# Patient Record
Sex: Female | Born: 1985 | Race: Black or African American | Hispanic: No | Marital: Single | Smoking: Never smoker
Health system: Southern US, Community
[De-identification: ages and names within clinical notes are randomized; demographics above are authoritative.]

## PROBLEM LIST (undated history)

## (undated) ENCOUNTER — Ambulatory Visit: Payer: 59 | Source: Home / Self Care

## (undated) DIAGNOSIS — H9312 Tinnitus, left ear: Secondary | ICD-10-CM

---

## 2020-04-27 ENCOUNTER — Other Ambulatory Visit: Payer: Self-pay

## 2020-04-27 ENCOUNTER — Emergency Department
Admission: EM | Admit: 2020-04-27 | Discharge: 2020-04-27 | Disposition: A | Discharge status: Home / Self Care | Payer: 59 | Attending: Emergency Medicine | Admitting: Emergency Medicine

## 2020-04-27 ENCOUNTER — Emergency Department: Payer: 59

## 2020-04-27 DIAGNOSIS — H93A2 Pulsatile tinnitus, left ear: Secondary | ICD-10-CM | POA: Diagnosis not present

## 2020-04-27 DIAGNOSIS — H93A9 Pulsatile tinnitus, unspecified ear: Secondary | ICD-10-CM

## 2020-04-27 DIAGNOSIS — H9202 Otalgia, left ear: Secondary | ICD-10-CM | POA: Diagnosis present

## 2020-04-27 LAB — CBC
HCT: 42.6 % (ref 36.0–46.0)
Hemoglobin: 14.2 g/dL (ref 12.0–15.0)
MCH: 29 pg (ref 26.0–34.0)
MCHC: 33.3 g/dL (ref 30.0–36.0)
MCV: 86.9 fL (ref 80.0–100.0)
Platelets: 343 10*3/uL (ref 150–400)
RBC: 4.9 MIL/uL (ref 3.87–5.11)
RDW: 12.9 % (ref 11.5–15.5)
WBC: 9.2 10*3/uL (ref 4.0–10.5)
nRBC: 0 % (ref 0.0–0.2)

## 2020-04-27 LAB — BASIC METABOLIC PANEL
Anion gap: 7 (ref 5–15)
BUN: 18 mg/dL (ref 6–20)
CO2: 26 mmol/L (ref 22–32)
Calcium: 9.3 mg/dL (ref 8.9–10.3)
Chloride: 105 mmol/L (ref 98–111)
Creatinine, Ser: 0.97 mg/dL (ref 0.44–1.00)
GFR, Estimated: >60 mL/min (ref >60)
Glucose, Bld: 96 mg/dL (ref 70–99)
Potassium: 3.8 mmol/L (ref 3.5–5.1)
Sodium: 138 mmol/L (ref 135–145)

## 2020-04-27 IMAGING — CT CT ANGIO HEAD-NECK (W OR W/O PERF)
1 of 10 series · 6 of 33 positions shown · IV contrast (APPLIED)
Comparison: None.

CLINICAL DATA: Pulsatile tinnitus

EXAM:
CT ANGIOGRAPHY HEAD AND NECK
TECHNIQUE: Multidetector CT imaging of the head and neck was performed using
the standard protocol during bolus administration of intravenous
contrast. Multiplanar CT image reconstructions and MIPs were
obtained to evaluate the vascular anatomy. Carotid stenosis
measurements (when applicable) are obtained utilizing NASCET
criteria, using the distal internal carotid diameter as the
denominator.
CONTRAST:  75mL OMNIPAQUE IOHEXOL 350 MG/ML SOLN

[Series 6: ax thin · axial · 0.36mm/px · z∈[+271,+495]mm · 6 of 340 slices shown]
[im 49/340  soft-tissue]
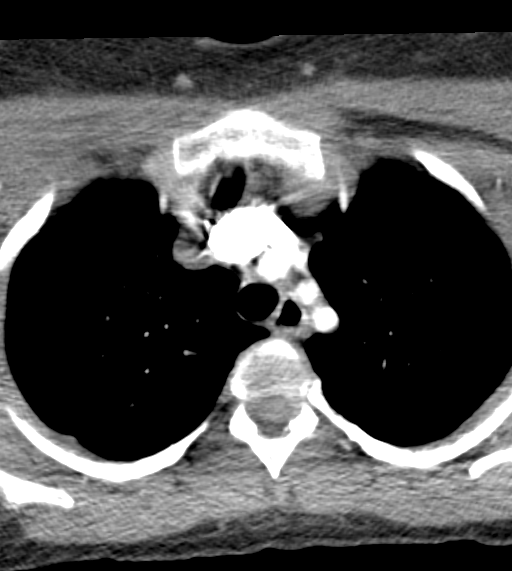
[im 97/340  bone]
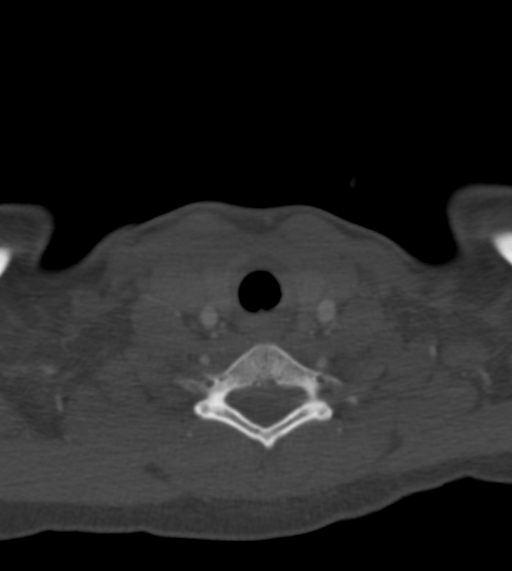
[im 146/340  soft-tissue]
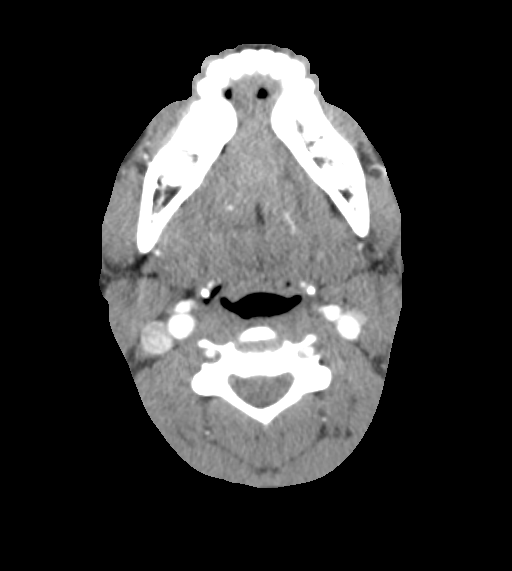
[im 194/340  bone]
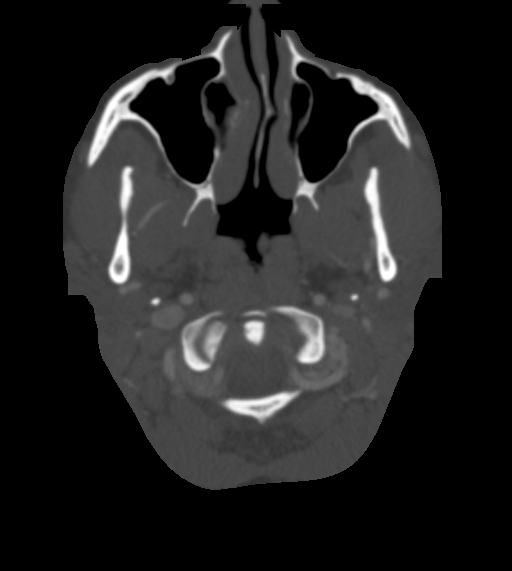
[im 243/340  soft-tissue]
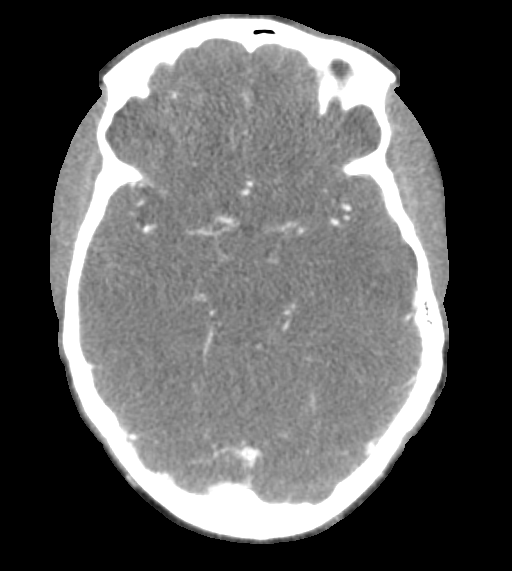
[im 291/340  bone]
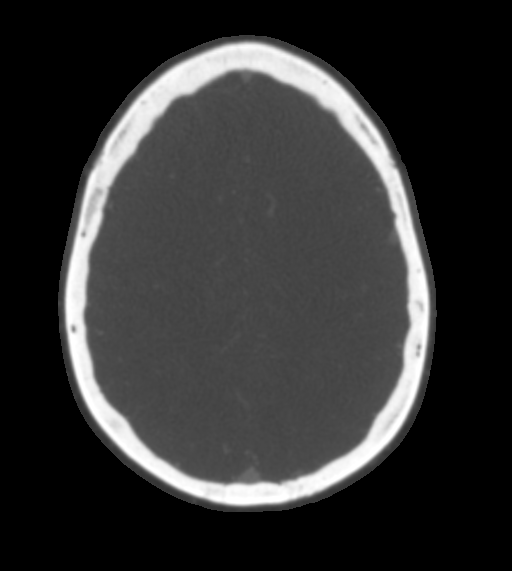

[6 of 33 positions shown; findings below may reference images not displayed]

FINDINGS: CT HEAD FINDINGS

Brain: There is no mass, hemorrhage or extra-axial collection. The
size and configuration of the ventricles and extra-axial CSF spaces
are normal. There is no acute or chronic infarction. The brain
parenchyma is normal.

Skull: The visualized skull base, calvarium and extracranial soft
tissues are normal.

Sinuses/Orbits: No fluid levels or advanced mucosal thickening of
the visualized paranasal sinuses. No mastoid or middle ear effusion.
The orbits are normal.

CTA NECK FINDINGS

SKELETON: There is no bony spinal canal stenosis. No lytic or
blastic lesion.

OTHER NECK: Normal pharynx, larynx and major salivary glands. No
cervical lymphadenopathy. Unremarkable thyroid gland.

UPPER CHEST: No pneumothorax or pleural effusion. No nodules or
masses.

AORTIC ARCH:

There is no calcific atherosclerosis of the aortic arch. There is no
aneurysm, dissection or hemodynamically significant stenosis of the
visualized portion of the aorta. Conventional 3 vessel aortic
branching pattern. The visualized proximal subclavian arteries are
widely patent.

RIGHT CAROTID SYSTEM: Normal without aneurysm, dissection or
stenosis.

LEFT CAROTID SYSTEM: Normal without aneurysm, dissection or
stenosis.

VERTEBRAL ARTERIES: Left dominant configuration. Both origins are
clearly patent. There is no dissection, occlusion or flow-limiting
stenosis to the skull base (V1-V3 segments).

CTA HEAD FINDINGS

POSTERIOR CIRCULATION:

--Vertebral arteries: Normal V4 segments.

--Inferior cerebellar arteries: Normal.

--Basilar artery: Normal.

--Superior cerebellar arteries: Normal.

--Posterior cerebral arteries (PCA): Normal.

ANTERIOR CIRCULATION:

--Intracranial internal carotid arteries: Normal.

--Anterior cerebral arteries (ACA): Normal. Both A1 segments are
present. Patent anterior communicating artery (a-comm).

--Middle cerebral arteries (MCA): Normal.

VENOUS SINUSES: As permitted by contrast timing, patent.

ANATOMIC VARIANTS: No aberrant internal carotid artery or
high-riding jugular venous bulb.

Review of the MIP images confirms the above findings.
IMPRESSION: Normal CTA of the head and neck.

## 2020-04-27 MED ORDER — IOHEXOL 350 MG/ML SOLN
75.0000 mL | Freq: Once | INTRAVENOUS | Status: AC | PRN
  Administered 2020-04-27: 75 mL via INTRAVENOUS

## 2020-04-27 NOTE — Discharge Instructions (Addendum)
The CT angiography of your head did not show any evidence of abnormalities.  Please call the number provided to arrange a follow-up appointment with ENT for further evaluation should your symptoms continue.  Return to the emergency department for any symptom personally concerning to yourself.

## 2020-04-27 NOTE — ED Notes (Signed)
Patient c/o nausea and asked for an emesis bag. Patient declined medication for nausea. MD aware.

## 2020-04-27 NOTE — ED Notes (Signed)
Patient walked with a steady gait to and from hallway bathroom to change into gown. No complaints of dizziness at this time.

## 2020-04-27 NOTE — ED Notes (Signed)
Patient is at imaging. 

## 2020-04-27 NOTE — ED Provider Notes (Signed)
[facility] Emergency Department Provider Note  Time seen: 8:18 PM  I have reviewed the triage vital signs and the nursing notes.   HISTORY  Chief Complaint Otalgia  HPI Tiffany Bowers is a 35 y.o. female with no significant past medical history who presents to the emergency department for evaluation of a pulsatile tinnitus.  According to the patient for the past 2 days or so she is heard a "whooshing" pulsatile sound in her left ear.  Denies any headache.  Denies any weakness or numbness of any arm or leg.  Patient went to urgent care and was referred to the emergency department for further evaluation.  Patient denies any ear pain.  Cannot currently hear the sound but this is a loud environment and the patient states she hurt it just before getting here.  No history of aneurysms.  History reviewed. No pertinent past medical history.  There are no problems to display for this patient.   History reviewed. No pertinent surgical history.  Prior to Admission medications   Not on File    Not on File  No family history on file.  Social History Social History   Tobacco Use  . Smoking status: Never Smoker  . Smokeless tobacco: Never Used  Substance Use Topics  . Alcohol use: Yes  . Drug use: Never    Review of Systems Constitutional: Negative for fever. ENT: Pulsatile tinnitus Cardiovascular: Negative for chest pain. Respiratory: Negative for shortness of breath. Gastrointestinal: Negative for abdominal pain Musculoskeletal: Negative for musculoskeletal complaints Neurological: Negative for headache.  Negative for weakness or numbness. All other ROS negative  ____________________________________________   PHYSICAL EXAM:  VITAL SIGNS: ED Triage Vitals [04/27/20 2008]  Enc Vitals Group     BP (!) 135/92     Pulse Rate 70     Resp 14     Temp 98.4 F (36.9 C)     Temp src      SpO2 100 %     Weight 190 lb (86.2 kg)     Height 5\' 10"  (1.778 m)      Head Circumference      Peak Flow      Pain Score 0     Pain Loc      Pain Edu?      Excl. in GC?    Constitutional: Alert and oriented. Well appearing and in no distress. Eyes: Normal exam ENT      Head: Normocephalic and atraumatic.      Mouth/Throat: Mucous membranes are moist. Cardiovascular: Normal rate, regular rhythm.  Respiratory: Normal respiratory effort without tachypnea nor retractions. Breath sounds are clear  Gastrointestinal: Soft and nontender. No distention.   Musculoskeletal: Nontender with normal range of motion in all extremities. Neurologic:  Normal speech and language. No gross focal neurologic deficits Skin:  Skin is warm, dry and intact.  Psychiatric: Mood and affect are normal.   ____________________________________________   RADIOLOGY  CT is pending  ____________________________________________   INITIAL IMPRESSION / ASSESSMENT AND PLAN / ED COURSE  Pertinent labs & imaging results that were available during my care of the patient were reviewed by me and considered in my medical decision making (see chart for details).   Patient presents to the emergency department for pulsatile tinnitus over the past 2 days.  Referred from urgent care for CT imaging.  Patient denies any ear pain.  No headache.  No weakness or numbness, confusion or slurred speech.  Overall the patient appears well  with a reassuring physical exam including normal tympanic membranes.  However given the patient's pulsatile tinnitus symptoms we will obtain a CTA of the head and neck to rule out aneurysmal dilation.  Patient agreeable to plan of care.  I discussed with the patient if CTAs are negative I would anticipate discharge home and symptoms hopefully will resolve spontaneously otherwise will follow up with ENT.  CTAs are pending.  Patient care signed out to Dr. Vicente Males.  Tiffany Bowers was evaluated in Emergency Department on 04/27/2020 for the symptoms described in the history of  present illness. She was evaluated in the context of the global COVID-19 pandemic, which necessitated consideration that the patient might be at risk for infection with the SARS-CoV-2 virus that causes COVID-19. Institutional protocols and algorithms that pertain to the evaluation of patients at risk for COVID-19 are in a state of rapid change based on information released by regulatory bodies including the CDC and federal and state organizations. These policies and algorithms were followed during the patient's care in the ED.  ____________________________________________   FINAL CLINICAL IMPRESSION(S) / ED DIAGNOSES  Pulsatile tinnitus   Minna Antis, MD 04/27/20 2021

## 2020-04-27 NOTE — ED Triage Notes (Signed)
Pt states for the past 2 days she has had wooshing sound in left ear that has been in sync with her pulse. Pt was seen at [facility] and told to come here, denies any pain or drainage from ear

## 2020-06-01 ENCOUNTER — Encounter: Payer: Self-pay | Admitting: Otolaryngology

## 2020-08-22 ENCOUNTER — Ambulatory Visit (HOSPITAL_COMMUNITY)
Admission: EM | Admit: 2020-08-22 | Discharge: 2020-08-22 | Disposition: A | Discharge status: Home / Self Care | Payer: Medicaid Other

## 2020-08-22 ENCOUNTER — Other Ambulatory Visit: Payer: Self-pay

## 2020-08-22 ENCOUNTER — Ambulatory Visit (HOSPITAL_COMMUNITY): Payer: Self-pay

## 2023-01-16 ENCOUNTER — Ambulatory Visit
Admission: EM | Admit: 2023-01-16 | Discharge: 2023-01-16 | Disposition: A | Discharge status: Home / Self Care | Payer: 59 | Attending: Family Medicine | Admitting: Family Medicine

## 2023-01-16 VITALS — BP 102/70 | HR 64 | Temp 98.6°F | Resp 16

## 2023-01-16 DIAGNOSIS — R300 Dysuria: Secondary | ICD-10-CM | POA: Insufficient documentation

## 2023-01-16 LAB — POCT URINALYSIS DIP (MANUAL ENTRY)
Bilirubin, UA: NEGATIVE
Glucose, UA: NEGATIVE mg/dL
Ketones, POC UA: NEGATIVE mg/dL
Leukocytes, UA: NEGATIVE
Nitrite, UA: NEGATIVE
Protein Ur, POC: NEGATIVE mg/dL
Spec Grav, UA: 1.025 (ref 1.010–1.025)
Urobilinogen, UA: 0.2 U/dL
pH, UA: 6 (ref 5.0–8.0)

## 2023-01-16 LAB — POCT URINE PREGNANCY: Preg Test, Ur: NEGATIVE

## 2023-01-16 MED ORDER — CEPHALEXIN 500 MG PO CAPS: 500.0000 mg | ORAL_CAPSULE | Freq: Two times a day (BID) | ORAL | 0 refills | Status: AC

## 2023-01-16 NOTE — ED Provider Notes (Addendum)
 [facility]    CSN: [id number] Arrival date & time: 01/16/23  1355      History   Chief Complaint No chief complaint on file.   HPI Tiffany Bowers is a 38 y.o. female presents for dysuria.  Patient reports 1 week of urinary burning with hematuria.  Denies urgency, frequency, fevers, nausea/vomiting, flank pain.  No vaginal discharge or STD concern.  Reports remote history of UTIs.  Is taking cranberry pills OTC which seemed to help.  No other concerns at this time.  HPI  History reviewed. No pertinent past medical history.  There are no active problems to display for this patient.   History reviewed. No pertinent surgical history.  OB History   No obstetric history on file.      Home Medications    Prior to Admission medications   Medication Sig Start Date End Date Taking? Authorizing Provider  cephALEXin  (KEFLEX ) 500 MG capsule Take 1 capsule (500 mg total) by mouth 2 (two) times daily for 7 days. 01/16/23 01/23/23 Yes Loreda Myla SAUNDERS, NP    Family History History reviewed. No pertinent family history.  Social History Social History   Tobacco Use   Smoking status: Never   Smokeless tobacco: Never  Substance Use Topics   Alcohol use: Yes   Drug use: Never     Allergies   Scopolamine   Review of Systems Review of Systems  Genitourinary:  Positive for dysuria.     Physical Exam Triage Vital Signs ED Triage Vitals  Encounter Vitals Group     BP 01/16/23 1435 102/70     Systolic BP Percentile --      Diastolic BP Percentile --      Pulse Rate 01/16/23 1435 64     Resp 01/16/23 1435 16     Temp 01/16/23 1435 98.6 F (37 C)     Temp Source 01/16/23 1435 Oral     SpO2 01/16/23 1435 97 %     Weight --      Height --      Head Circumference --      Peak Flow --      Pain Score 01/16/23 1434 1     Pain Loc --      Pain Education --      Exclude from Growth Chart --    No data found.  Updated Vital Signs BP 102/70 (BP Location: Left Arm)    Pulse 64   Temp 98.6 F (37 C) (Oral)   Resp 16   SpO2 97%   Visual Acuity Right Eye Distance:   Left Eye Distance:   Bilateral Distance:    Right Eye Near:   Left Eye Near:    Bilateral Near:     Physical Exam Vitals and nursing note reviewed.  Constitutional:      Appearance: Normal appearance.  HENT:     Head: Normocephalic and atraumatic.  Eyes:     Pupils: Pupils are equal, round, and reactive to light.  Cardiovascular:     Rate and Rhythm: Normal rate.  Pulmonary:     Effort: Pulmonary effort is normal.  Abdominal:     Tenderness: There is no right CVA tenderness or left CVA tenderness.  Skin:    General: Skin is warm and dry.  Neurological:     General: No focal deficit present.     Mental Status: She is alert and oriented to person, place, and time.  Psychiatric:  Mood and Affect: Mood normal.        Behavior: Behavior normal.      UC Treatments / Results  Labs (all labs ordered are listed, but only abnormal results are displayed) Labs Reviewed  POCT URINALYSIS DIP (MANUAL ENTRY) - Abnormal; Notable for the following components:      Result Value   Blood, UA trace-intact (*)    All other components within normal limits  URINE CULTURE  POCT URINE PREGNANCY  CERVICOVAGINAL ANCILLARY ONLY    EKG   Radiology No results found.  Procedures Procedures (including critical care time)  Medications Ordered in UC Medications - No data to display  Initial Impression / Assessment and Plan / UC Course  I have reviewed the triage vital signs and the nursing notes.  Pertinent labs & imaging results that were available during my care of the patient were reviewed by me and considered in my medical decision making (see chart for details).     Reviewed exam and symptoms with patient.  No red flags.  UA with trace blood otherwise unremarkable.  Will send urine culture and contact if positive.  Patient would prefer to start treatment while awaiting  culture results, start Keflex  twice daily for 7 days.  Patient also wanted testing for BV and yeast.  Increase fluids and rest.  PCP follow-up if symptoms do not improve.  ER precautions reviewed. Final Clinical Impressions(s) / UC Diagnoses   Final diagnoses:  Dysuria     Discharge Instructions      The clinic will contact you with results of urine culture done today if positive.  Start Keflex  twice daily for 7 days.  Lots of rest and fluids.  Please follow-up with your PCP if your symptoms do not improve.  Please go to the ER if you develop any worsening symptoms.  I hope you feel better soon!    ED Prescriptions     Medication Sig Dispense Auth. Provider   cephALEXin  (KEFLEX ) 500 MG capsule Take 1 capsule (500 mg total) by mouth 2 (two) times daily for 7 days. 14 capsule Sya Nestler, Jodi R, NP      PDMP not reviewed this encounter.   Loreda Myla SAUNDERS, NP 01/16/23 1455    Loreda Myla SAUNDERS, NP 01/16/23 [phone]

## 2023-01-16 NOTE — ED Triage Notes (Signed)
 Pt presents to UC for c/o burning on urination and hematuria for almost a week. Took cranberry pills which helped some.

## 2023-01-16 NOTE — Discharge Instructions (Addendum)
 The clinic will contact you with results of urine culture done today if positive.  Start Keflex  twice daily for 7 days.  Lots of rest and fluids.  Please follow-up with your PCP if your symptoms do not improve.  Please go to the ER if you develop any worsening symptoms.  I hope you feel better soon!

## 2023-01-17 LAB — URINE CULTURE: Culture: NO GROWTH

## 2023-01-17 LAB — CERVICOVAGINAL ANCILLARY ONLY
Bacterial Vaginitis (gardnerella): POSITIVE — AB
Candida Glabrata: NEGATIVE
Candida Vaginitis: NEGATIVE
Comment: NEGATIVE
Comment: NEGATIVE
Comment: NEGATIVE

## 2023-08-30 ENCOUNTER — Other Ambulatory Visit: Payer: Self-pay

## 2023-08-30 ENCOUNTER — Emergency Department (HOSPITAL_BASED_OUTPATIENT_CLINIC_OR_DEPARTMENT_OTHER)
Admission: EM | Admit: 2023-08-30 | Discharge: 2023-08-31 | Disposition: A | Discharge status: Home / Self Care | Attending: Emergency Medicine | Admitting: Emergency Medicine

## 2023-08-30 ENCOUNTER — Emergency Department (HOSPITAL_BASED_OUTPATIENT_CLINIC_OR_DEPARTMENT_OTHER)

## 2023-08-30 ENCOUNTER — Encounter (HOSPITAL_BASED_OUTPATIENT_CLINIC_OR_DEPARTMENT_OTHER): Payer: Self-pay | Admitting: Emergency Medicine

## 2023-08-30 DIAGNOSIS — R0609 Other forms of dyspnea: Secondary | ICD-10-CM | POA: Diagnosis not present

## 2023-08-30 DIAGNOSIS — R918 Other nonspecific abnormal finding of lung field: Secondary | ICD-10-CM | POA: Diagnosis not present

## 2023-08-30 DIAGNOSIS — R0600 Dyspnea, unspecified: Secondary | ICD-10-CM

## 2023-08-30 DIAGNOSIS — J189 Pneumonia, unspecified organism: Secondary | ICD-10-CM

## 2023-08-30 DIAGNOSIS — R0602 Shortness of breath: Secondary | ICD-10-CM | POA: Insufficient documentation

## 2023-08-30 DIAGNOSIS — J168 Pneumonia due to other specified infectious organisms: Secondary | ICD-10-CM | POA: Diagnosis not present

## 2023-08-30 HISTORY — DX: Tinnitus, left ear: H93.12

## 2023-08-30 LAB — BASIC METABOLIC PANEL WITH GFR
Anion gap: 10 (ref 5–15)
BUN: 16 mg/dL (ref 6–20)
CO2: 23 mmol/L (ref 22–32)
Calcium: 8.9 mg/dL (ref 8.9–10.3)
Chloride: 107 mmol/L (ref 98–111)
Creatinine, Ser: 0.53 mg/dL (ref 0.44–1.00)
GFR, Estimated: >60 mL/min (ref >60)
Glucose, Bld: 110 mg/dL — ABNORMAL HIGH (ref 70–99)
Potassium: 3.5 mmol/L (ref 3.5–5.1)
Sodium: 140 mmol/L (ref 135–145)

## 2023-08-30 LAB — CBC
HCT: 36.9 % (ref 36.0–46.0)
Hemoglobin: 12.4 g/dL (ref 12.0–15.0)
MCH: 28.6 pg (ref 26.0–34.0)
MCHC: 33.6 g/dL (ref 30.0–36.0)
MCV: 85.2 fL (ref 80.0–100.0)
Platelets: 281 K/uL (ref 150–400)
RBC: 4.33 MIL/uL (ref 3.87–5.11)
RDW: 12.1 % (ref 11.5–15.5)
WBC: 6.5 K/uL (ref 4.0–10.5)
nRBC: 0 % (ref 0.0–0.2)

## 2023-08-30 NOTE — ED Triage Notes (Signed)
 Pt c/o dyspnea on exertion x 3 weeks, progressively worsening. Reports today she was having difficulty catching her breath while having a conversation.   Also reports elevated HR today on smart watch. Denies chest pain, fever, recent illness, medication changes.

## 2023-08-31 ENCOUNTER — Emergency Department (HOSPITAL_BASED_OUTPATIENT_CLINIC_OR_DEPARTMENT_OTHER)

## 2023-08-31 DIAGNOSIS — R918 Other nonspecific abnormal finding of lung field: Secondary | ICD-10-CM | POA: Diagnosis not present

## 2023-08-31 DIAGNOSIS — R0602 Shortness of breath: Secondary | ICD-10-CM | POA: Diagnosis not present

## 2023-08-31 DIAGNOSIS — R0609 Other forms of dyspnea: Secondary | ICD-10-CM | POA: Diagnosis not present

## 2023-08-31 LAB — D-DIMER, QUANTITATIVE: D-Dimer, Quant: 0.63 ug{FEU}/mL — ABNORMAL HIGH (ref 0.00–0.50)

## 2023-08-31 LAB — HCG, SERUM, QUALITATIVE: Preg, Serum: NEGATIVE

## 2023-08-31 MED ORDER — SODIUM CHLORIDE 0.9 % IV BOLUS
1000.0000 mL | Freq: Once | INTRAVENOUS | Status: AC
  Administered 2023-08-31: 1000 mL via INTRAVENOUS

## 2023-08-31 MED ORDER — IOHEXOL 350 MG/ML SOLN
75.0000 mL | Freq: Once | INTRAVENOUS | Status: AC | PRN
  Administered 2023-08-31: 75 mL via INTRAVENOUS

## 2023-08-31 MED ORDER — AZITHROMYCIN 250 MG PO TABS: 250.0000 mg | ORAL_TABLET | Freq: Every day | ORAL | 0 refills | Status: AC

## 2023-08-31 MED ORDER — AZITHROMYCIN 250 MG PO TABS
500.0000 mg | ORAL_TABLET | Freq: Once | ORAL | Status: AC
  Administered 2023-08-31: 500 mg via ORAL
  Filled 2023-08-31: qty 2

## 2023-08-31 NOTE — ED Provider Notes (Signed)
 [facility] Provider Note   CSN: [id number] Arrival date & time: 08/30/23  2140     Patient presents with: Shortness of Breath   Tiffany Bowers is a 38 y.o. female.   Patient is a 38 year old female with no significant past medical history.  Patient presenting today with complaints of shortness of breath.  She has noted dyspnea with exertion over the past several weeks, but now seems to be present at rest and with conversations.  She tells me that her smart watch has notified that she has had a higher resting heart rate than normal.  She denies to me she is having any chest pain or difficulty breathing.  No fevers, chills, or cough.  No leg swelling or calf pain.  She has a progesterone IUD, but denies tobacco use.       Prior to Admission medications   Not on File    Allergies: Scopolamine    Review of Systems  All other systems reviewed and are negative.   Updated Vital Signs BP 133/79 (BP Location: Right Arm)   Pulse (!) 112   Temp 98.3 F (36.8 C)   Resp 18   Ht 5' 10 (1.778 m)   Wt 81.6 kg   SpO2 99%   BMI 25.83 kg/m   Physical Exam Vitals and nursing note reviewed.  Constitutional:      General: She is not in acute distress.    Appearance: She is well-developed. She is not diaphoretic.  HENT:     Head: Normocephalic and atraumatic.  Cardiovascular:     Rate and Rhythm: Normal rate and regular rhythm.     Heart sounds: No murmur heard.    No friction rub. No gallop.  Pulmonary:     Effort: Pulmonary effort is normal. No respiratory distress.     Breath sounds: Normal breath sounds. No wheezing.  Abdominal:     General: Bowel sounds are normal. There is no distension.     Palpations: Abdomen is soft.     Tenderness: There is no abdominal tenderness.  Musculoskeletal:        General: Normal range of motion.     Cervical back: Normal range of motion and neck supple.     Right lower leg: No tenderness. No  edema.     Left lower leg: No tenderness. No edema.  Skin:    General: Skin is warm and dry.  Neurological:     General: No focal deficit present.     Mental Status: She is alert and oriented to person, place, and time.     (all labs ordered are listed, but only abnormal results are displayed) Labs Reviewed  BASIC METABOLIC PANEL WITH GFR - Abnormal; Notable for the following components:      Result Value   Glucose, Bld 110 (*)    All other components within normal limits  CBC  PREGNANCY, URINE  D-DIMER, QUANTITATIVE  HCG, SERUM, QUALITATIVE    EKG: None  Radiology: DG Chest 2 View Result Date: 08/30/2023 CLINICAL DATA:  Shortness of breath EXAM: CHEST - 2 VIEW COMPARISON:  None Available. FINDINGS: The heart size and mediastinal contours are within normal limits. Both lungs are clear. The visualized skeletal structures are unremarkable. No pneumothorax. IMPRESSION: No active cardiopulmonary disease. Electronically Signed   By: Franky Crease M.D.   On: 08/30/2023 22:11     Procedures   Medications Ordered in the ED - No data to display  Medical Decision Making Amount and/or Complexity of Data Reviewed Labs: ordered. Radiology: ordered.  Risk Prescription drug management.   Patient is a 38 year old female presenting with complaints of shortness of breath as described in the HPI.  Patient arrives here with stable vital signs and is afebrile.  Physical examination basically unremarkable.  Laboratory studies obtained including CBC, metabolic panel, and D-dimer.  Studies all unremarkable with the exception of a mildly elevated D-dimer of 0.63.  Pregnancy test is negative.  Chest x-ray obtained showing no acute abnormality.  Due to the elevated D-dimer, a CTA of the chest was obtained showing 2 small nodules in the right lung, likely inflammatory/infectious in nature.  Patient to be treated with antibiotics for presumed pneumonia and is  advised to follow-up with primary doctor.  Radiology is recommending a 110-month follow-up on these nodules.     Final diagnoses:  None    ED Discharge Orders     None          Geroldine Berg, MD 08/31/23 [phone]

## 2023-08-31 NOTE — ED Notes (Signed)
 Patient returned from radiology

## 2023-08-31 NOTE — ED Notes (Signed)
 RN provided AVS using Teachback Method. Patient verbalizes understanding of Discharge Instructions. Opportunity for Questioning and Answers were provided by RN. Patient Discharged from ED ambulatory to home with significant other.

## 2023-08-31 NOTE — Discharge Instructions (Signed)
 Begin taking Zithromax  as prescribed.  Follow-up with your primary doctor in 6 months for reevaluation, and return to the ER if your symptoms significantly worsen or change.

## 2023-12-22 DIAGNOSIS — R002 Palpitations: Secondary | ICD-10-CM | POA: Diagnosis not present

## 2024-01-09 DIAGNOSIS — E0590 Thyrotoxicosis, unspecified without thyrotoxic crisis or storm: Secondary | ICD-10-CM | POA: Diagnosis not present

## 2024-01-09 DIAGNOSIS — Z8349 Family history of other endocrine, nutritional and metabolic diseases: Secondary | ICD-10-CM | POA: Diagnosis not present

## 2024-01-09 DIAGNOSIS — R748 Abnormal levels of other serum enzymes: Secondary | ICD-10-CM | POA: Diagnosis not present

## 2024-01-09 DIAGNOSIS — E049 Nontoxic goiter, unspecified: Secondary | ICD-10-CM | POA: Diagnosis not present
